# Patient Record
Sex: Male | Born: 1970 | Race: Black or African American | Hispanic: No | Marital: Single | Smoking: Never smoker
Health system: Southern US, Community
[De-identification: ages and names within clinical notes are randomized; demographics above are authoritative.]

## PROBLEM LIST (undated history)

## (undated) DIAGNOSIS — M419 Scoliosis, unspecified: Secondary | ICD-10-CM

## (undated) DIAGNOSIS — M549 Dorsalgia, unspecified: Secondary | ICD-10-CM

---

## 2011-05-11 ENCOUNTER — Emergency Department (HOSPITAL_COMMUNITY)
Admission: EM | Admit: 2011-05-11 | Discharge: 2011-05-11 | Disposition: A | Discharge status: Home / Self Care | Payer: Self-pay | Attending: Emergency Medicine | Admitting: Emergency Medicine

## 2011-05-11 DIAGNOSIS — W57XXXA Bitten or stung by nonvenomous insect and other nonvenomous arthropods, initial encounter: Secondary | ICD-10-CM | POA: Insufficient documentation

## 2011-05-11 DIAGNOSIS — R21 Rash and other nonspecific skin eruption: Secondary | ICD-10-CM | POA: Insufficient documentation

## 2011-05-11 DIAGNOSIS — T148 Other injury of unspecified body region: Secondary | ICD-10-CM | POA: Insufficient documentation

## 2016-01-04 ENCOUNTER — Encounter (HOSPITAL_COMMUNITY): Payer: Self-pay | Admitting: Nurse Practitioner

## 2016-01-04 ENCOUNTER — Emergency Department (HOSPITAL_COMMUNITY)
Admission: EM | Admit: 2016-01-04 | Discharge: 2016-01-04 | Disposition: A | Discharge status: Home / Self Care | Payer: No Typology Code available for payment source | Attending: Emergency Medicine | Admitting: Emergency Medicine

## 2016-01-04 ENCOUNTER — Emergency Department (HOSPITAL_COMMUNITY): Payer: No Typology Code available for payment source

## 2016-01-04 DIAGNOSIS — M419 Scoliosis, unspecified: Secondary | ICD-10-CM | POA: Insufficient documentation

## 2016-01-04 DIAGNOSIS — Y92410 Unspecified street and highway as the place of occurrence of the external cause: Secondary | ICD-10-CM | POA: Diagnosis not present

## 2016-01-04 DIAGNOSIS — S3992XA Unspecified injury of lower back, initial encounter: Secondary | ICD-10-CM | POA: Insufficient documentation

## 2016-01-04 DIAGNOSIS — S3991XA Unspecified injury of abdomen, initial encounter: Secondary | ICD-10-CM | POA: Diagnosis not present

## 2016-01-04 DIAGNOSIS — Y9389 Activity, other specified: Secondary | ICD-10-CM | POA: Diagnosis not present

## 2016-01-04 DIAGNOSIS — R1030 Lower abdominal pain, unspecified: Secondary | ICD-10-CM

## 2016-01-04 DIAGNOSIS — Y998 Other external cause status: Secondary | ICD-10-CM | POA: Insufficient documentation

## 2016-01-04 DIAGNOSIS — M545 Low back pain: Secondary | ICD-10-CM

## 2016-01-04 HISTORY — DX: Scoliosis, unspecified: M41.9

## 2016-01-04 HISTORY — DX: Dorsalgia, unspecified: M54.9

## 2016-01-04 MED ORDER — IBUPROFEN 200 MG PO TABS
600.0000 mg | ORAL_TABLET | Freq: Once | ORAL | Status: AC
  Administered 2016-01-04: 600 mg via ORAL
  Filled 2016-01-04: qty 3

## 2016-01-04 MED ORDER — HYDROCODONE-ACETAMINOPHEN 5-325 MG PO TABS
1.0000 | ORAL_TABLET | Freq: Once | ORAL | Status: AC
  Administered 2016-01-04: 1 via ORAL
  Filled 2016-01-04: qty 1

## 2016-01-04 MED ORDER — IBUPROFEN 600 MG PO TABS: 600.0000 mg | ORAL_TABLET | Freq: Three times a day (TID) | ORAL | Status: AC | PRN

## 2016-01-04 MED ORDER — HYDROCODONE-ACETAMINOPHEN 5-325 MG PO TABS: 1.0000 | ORAL_TABLET | ORAL | Status: DC | PRN
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
## 2016-01-12 ENCOUNTER — Ambulatory Visit: Payer: Self-pay

## 2016-01-19 ENCOUNTER — Ambulatory Visit: Payer: Self-pay | Attending: Internal Medicine

## 2016-02-23 ENCOUNTER — Ambulatory Visit: Payer: Self-pay | Admitting: Family Medicine

## 2016-05-08 ENCOUNTER — Emergency Department (HOSPITAL_COMMUNITY)
Admission: EM | Admit: 2016-05-08 | Discharge: 2016-05-08 | Disposition: A | Discharge status: Home / Self Care | Payer: PRIVATE HEALTH INSURANCE | Attending: Emergency Medicine | Admitting: Emergency Medicine

## 2016-05-08 ENCOUNTER — Encounter (HOSPITAL_COMMUNITY): Payer: Self-pay

## 2016-05-08 DIAGNOSIS — R1013 Epigastric pain: Secondary | ICD-10-CM | POA: Diagnosis present

## 2016-05-08 DIAGNOSIS — K219 Gastro-esophageal reflux disease without esophagitis: Secondary | ICD-10-CM | POA: Diagnosis not present

## 2016-05-08 LAB — CBC
HEMATOCRIT: 39.7 % (ref 39.0–52.0)
HEMOGLOBIN: 13 g/dL (ref 13.0–17.0)
MCH: 29.7 pg (ref 26.0–34.0)
MCHC: 32.7 g/dL (ref 30.0–36.0)
MCV: 90.8 fL (ref 78.0–100.0)
PLATELETS: 323 10*3/uL (ref 150–400)
RBC: 4.37 MIL/uL (ref 4.22–5.81)
RDW: 12.3 % (ref 11.5–15.5)
WBC: 10.1 10*3/uL (ref 4.0–10.5)

## 2016-05-08 LAB — URINALYSIS, ROUTINE W REFLEX MICROSCOPIC
Bilirubin Urine: NEGATIVE
GLUCOSE, UA: NEGATIVE mg/dL
HGB URINE DIPSTICK: NEGATIVE
Ketones, ur: NEGATIVE mg/dL
LEUKOCYTES UA: NEGATIVE
Nitrite: NEGATIVE
PROTEIN: NEGATIVE mg/dL
SPECIFIC GRAVITY, URINE: 1.009 (ref 1.005–1.030)
pH: 7.5 (ref 5.0–8.0)

## 2016-05-08 LAB — COMPREHENSIVE METABOLIC PANEL
ALT: 13 U/L — AB (ref 17–63)
ANION GAP: 10 (ref 5–15)
AST: 21 U/L (ref 15–41)
Albumin: 4 g/dL (ref 3.5–5.0)
Alkaline Phosphatase: 57 U/L (ref 38–126)
BUN: 8 mg/dL (ref 6–20)
CHLORIDE: 104 mmol/L (ref 101–111)
CO2: 23 mmol/L (ref 22–32)
Calcium: 9.5 mg/dL (ref 8.9–10.3)
Creatinine, Ser: 1.07 mg/dL (ref 0.61–1.24)
Glucose, Bld: 147 mg/dL — ABNORMAL HIGH (ref 65–99)
POTASSIUM: 3.8 mmol/L (ref 3.5–5.1)
SODIUM: 137 mmol/L (ref 135–145)
Total Bilirubin: 0.5 mg/dL (ref 0.3–1.2)
Total Protein: 6.2 g/dL — ABNORMAL LOW (ref 6.5–8.1)

## 2016-05-08 LAB — POC OCCULT BLOOD, ED: Fecal Occult Bld: NEGATIVE

## 2016-05-08 LAB — LIPASE, BLOOD: LIPASE: 40 U/L (ref 11–51)

## 2016-05-08 MED ORDER — HYDROCODONE-ACETAMINOPHEN 5-325 MG PO TABS: ORAL_TABLET | ORAL | Status: AC

## 2016-05-08 MED ORDER — SUCRALFATE 1 GM/10ML PO SUSP: 1.0000 g | Freq: Three times a day (TID) | ORAL | Status: AC

## 2016-05-08 MED ORDER — PANTOPRAZOLE SODIUM 40 MG PO TBEC
40.0000 mg | DELAYED_RELEASE_TABLET | Freq: Once | ORAL | Status: AC
  Administered 2016-05-08: 40 mg via ORAL
  Filled 2016-05-08: qty 1

## 2016-05-08 MED ORDER — PANTOPRAZOLE SODIUM 20 MG PO TBEC: 20.0000 mg | DELAYED_RELEASE_TABLET | Freq: Two times a day (BID) | ORAL | Status: AC

## 2016-05-08 MED ORDER — GI COCKTAIL ~~LOC~~
30.0000 mL | Freq: Once | ORAL | Status: AC
  Administered 2016-05-08: 30 mL via ORAL
  Filled 2016-05-08: qty 30

## 2016-05-08 NOTE — ED Notes (Signed)
Pt ambulated to and from restroom. 

## 2016-05-08 NOTE — ED Notes (Signed)
PA Nicole at the bedside  

## 2016-05-08 NOTE — ED Notes (Signed)
Patient here with intermittent abdominal cramps for 2-3 months. No nausea, no diarrhea. Has ben taking reflux meds with some relief. No associated symptoms

## 2016-05-08 NOTE — Discharge Instructions (Signed)
Do not take any NSAIDs (motrin, ibuprofen, advil, aleve, excedrin, aspirin, naproxen, goody's powder,  etc.) because they can further irritate your stomach  Do not hesitate to return to the emergency room for any new, worsening or concerning symptoms.  Please obtain primary care using resource guide below. Let them know that you were seen in the emergency room and that they will need to obtain records for further outpatient management.    Food Choices for Gastroesophageal Reflux Disease, Adult When you have gastroesophageal reflux disease (GERD), the foods you eat and your eating habits are very important. Choosing the right foods can help ease your discomfort.  WHAT GUIDELINES DO I NEED TO FOLLOW?   Choose fruits, vegetables, whole grains, and low-fat dairy products.   Choose low-fat meat, fish, and poultry.  Limit fats such as oils, salad dressings, butter, nuts, and avocado.   Keep a food diary. This helps you identify foods that cause symptoms.   Avoid foods that cause symptoms. These may be different for everyone.   Eat small meals often instead of 3 large meals a day.   Eat your meals slowly, in a place where you are relaxed.   Limit fried foods.   Cook foods using methods other than frying.   Avoid drinking alcohol.   Avoid drinking large amounts of liquids with your meals.   Avoid bending over or lying down until 2-3 hours after eating.  WHAT FOODS ARE NOT RECOMMENDED?  These are some foods and drinks that may make your symptoms worse: Vegetables Tomatoes. Tomato juice. Tomato and spaghetti sauce. Chili peppers. Onion and garlic. Horseradish. Fruits Oranges, grapefruit, and lemon (fruit and juice). Meats High-fat meats, fish, and poultry. This includes hot dogs, ribs, ham, sausage, salami, and bacon. Dairy Whole milk and chocolate milk. Sour cream. Cream. Butter. Ice cream. Cream cheese.  Drinks Coffee and tea. Bubbly (carbonated) drinks or energy  drinks. Condiments Hot sauce. Barbecue sauce.  Sweets/Desserts Chocolate and cocoa. Donuts. Peppermint and spearmint. Fats and Oils High-fat foods. This includes JamaicaFrench fries and potato chips. Other Vinegar. Strong spices. This includes black pepper, white pepper, red pepper, cayenne, curry powder, cloves, ginger, and chili powder. The items listed above may not be a complete list of foods and drinks to avoid. Contact your dietitian for more information.   This information is not intended to replace advice given to you by your health care provider. Make sure you discuss any questions you have with your health care provider.   Document Released: 05/02/2012 Document Revised: 11/22/2014 Document Reviewed: 09/05/2013 Elsevier Interactive Patient Education 2016 [employer]  [employer] Resource Guide Financial Assistance The United Ways 211 is a great source of information about community services available.  Access by dialing 2-1-1 from anywhere in [place], or by website -  PooledIncome.plwww.nc211.org.   Other Local Resources (Updated 11/2015)  Financial Assistance   Services    Phone Number and Address  [facility]  Low-cost medical care - 1st and 3rd Saturday of every month  Must not qualify for public or private insurance and must have limited income [phone] [address] [place], [place]    [place] Department of Social Services  Child care  Emergency assistance for housing and Kimberly-Clarkutilities  Food stamps  Medicaid [phone] [address] [place], [place] [address]   [facility]  Low-cost medical care for children, communicable diseases, sexually-transmitted diseases, immunizations, maternity care, womens health and family planning [phone] [place], [place]  [address]  [facility]   Medication assistance for [facility]  residents  Must meet income requirements [phone] [address] [place], [place].    [facility] Social Services  Child care  Emergency assistance for housing and Kimberly-Clarkutilities  Food stamps  Medicaid [phone] [address] [place], [place] [address]  [facility]   Low-cost medical care,   Monday through Friday, 9 am to 6 pm.   Accepts Medicare/Medicaid, and self-pay [phone] [address] [place], [place] [address]  [facility]  Low-cost medical care - Monday through Friday, 8:30 am - 5:30 pm  Accepts Medicaid and self-pay [phone] [address] [place], [place] [address]   [facility]  Primary medical care, including for those with sickle cell disease  Accepts Medicare, Medicaid, insurance and self-pay [phone] [address] [place], [place]  [facility]   Primary medical care  Accepts Medicare, [place], insurance and self-pay [phone] [address], [address] [place], [place] [address]   [facility] of Social Services  Child care  Emergency assistance for housing and Kimberly-[employer]arkutilities  Food stamps  Medicaid [phone] [place], [place] [address]  [facility] of Health and [employer]Human Services  Child care  Emergency assistance for housing and Kimberly-Clarkutilities  Food stamps  Medicaid [phone] [address] [place], [place] [address]   [facility] Medication [facility]  Medication assistance for [facility] residents with no insurance only  Must have a primary care doctor [phone] [address], [address] [place], [place]  [facility]   Primary medical care  [place] Medicare, [place], insurance  [phone] [address] [place], [place]  MedAssist   Medication assistance [phone]  [facility]   Primary  medical care  Accepts Medicare, [place], insurance and self-pay [phone] [address] [place], [place] [address]  [facility]   Primary medical care  Accepts Medicare, [place], insurance and self-pay [phone] [address] [place], [place] [address]  [facility]  For [place] residents between the ages of 9318 and 4264 who do not have any form of health insurance, Medicare, [place]icaid, or [place] benefits.  Services are provided free of charge to uninsured patients who fall within federal poverty guidelines.    Hours: Tuesdays and Thursdays, 4:15 - 8 pm [phone] [address], [address] [place], [place] [address]  [facility]     Primary medical care  Dental care  Nutritional counseling  Pharmacy  Accepts Medicaid, Medicare, most insurance.  Fees are adjusted based on ability to pay.   [phone] [facility] [address] [place], [place]  [phone] [facility]les [facility] [address] [place], [place]  [phone] [facility] [place], [place]  [phone] [facility], [address] [place], [place]  [phone] [facility] [address] [place], [place]  [facility]  Womens health and family planning [phone] [address] [place], [place]  [facility] Department of Social Services  Child care  Emergency assistance for housing and Kimberly-Clarkutilities  Food stamps  Medicaid [phone] [address], [place], [place] [address]   [facility]    Ages 5218 and older  Hours: Mondays and Thursdays, 7:00 am - 9:00 am Patients are seen on a first come, first served  basis. [phone], [phone] [place], [place]  [facility] Division of Social Services  Child care  Emergency assistance for housing and [employer]  Medicaid  [phone] [address] [place], [place] [address]  [employer]  Medication assistance  Rental assistance  Food pantry  Medication assistance  Housing assistance  Emergency food distribution  Utility assistance [phone] [address] [place], [place]  [phone]  [address] [place], [place] [address] Hours: Tuesdays and Thursdays from 9am - 12 noon by appointment only  [phone] [address] [place], [place] [address]  [facility] Lanae Boast   Accepts private insurance, [place], and [place].  Payment is based on a sliding scale for those without insurance.  Hours: Mondays, Tuesdays and Thursdays, 8:30 am - 5:30 pm.   [phone] [place], [place]  [facility] - Family Medicine at [facility], [place], and [place].  Payment is based on a sliding scale for those without insurance. [phone] [address] [place], [place]  [facility] - Pediatrics at E. Scientist, research (physical sciences), [employer], and [place].  Payment is based on a sliding scale for those without insurance [phone] [address], [employer], [place]  [facility] - Pediatrics at [employer], [place], and [place].  Payment is based on a sliding scale for those without insurance. [phone] [address] [place], [place]  [facility] - Pediatrics at [facility], [place], and [place].  Payment is based on a sliding scale for those without insurance. [phone], [phone] [place], [place].    [facility]  Maternity care.  Accepts Medicaid and self-pay. [phone] [address] [place], [place]
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
## 2016-12-15 ENCOUNTER — Other Ambulatory Visit: Payer: Self-pay | Admitting: Orthopaedic Surgery

## 2016-12-15 DIAGNOSIS — M47816 Spondylosis without myelopathy or radiculopathy, lumbar region: Secondary | ICD-10-CM

## 2016-12-21 ENCOUNTER — Ambulatory Visit
Admission: RE | Admit: 2016-12-21 | Discharge: 2016-12-21 | Disposition: A | Discharge status: Home / Self Care | Payer: Self-pay | Source: Ambulatory Visit | Attending: Orthopaedic Surgery | Admitting: Orthopaedic Surgery

## 2016-12-21 DIAGNOSIS — M47816 Spondylosis without myelopathy or radiculopathy, lumbar region: Secondary | ICD-10-CM

## 2018-05-01 IMAGING — MR MR LUMBAR SPINE W/O CM
4 of 5 series · 18 of 48 positions shown · non-contrast
Comparison: None.

CLINICAL DATA: Back spasms, low back pain and bilateral lower
extremity numbness

EXAM:
MRI LUMBAR SPINE WITHOUT CONTRAST
TECHNIQUE: Multiplanar, multisequence MR imaging of the lumbar spine was
performed. No intravenous contrast was administered.

[Series 6: T2 · sagittal · 4.0mm · 0.73mm/px · 6 of 15 slices shown (1 of 2)]
[im 1/15]
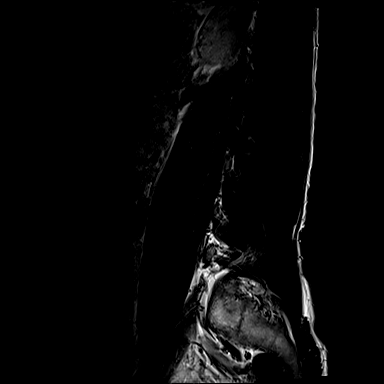
[im 3/15]
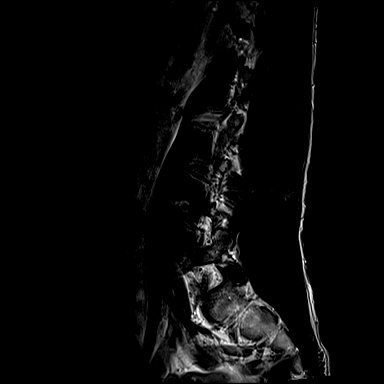
[im 6/15]
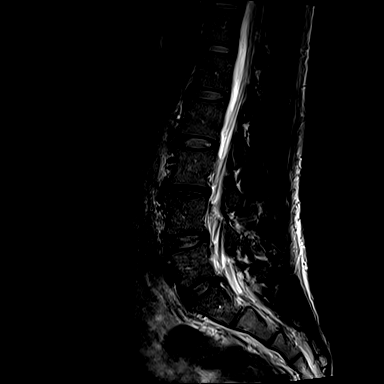
[im 9/15]
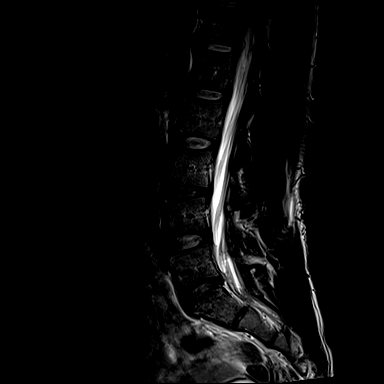
[im 12/15]
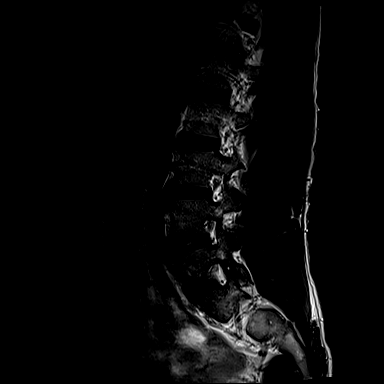
[im 15/15]
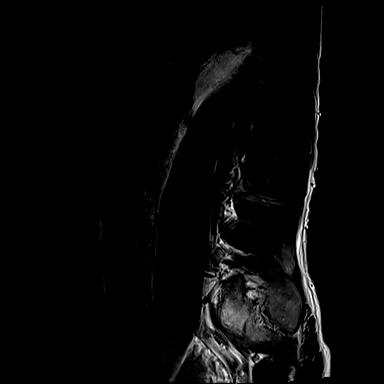

[Series 7: T1 · sagittal · 4.0mm · 0.73mm/px · 3 of 15 slices shown (1 of 2)]
[im 3/15]
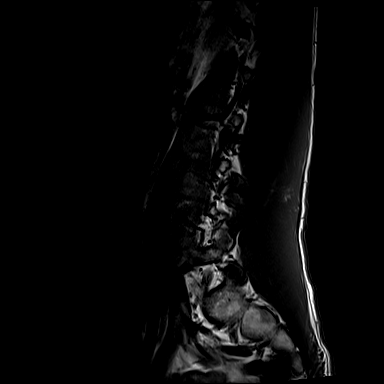
[im 9/15]
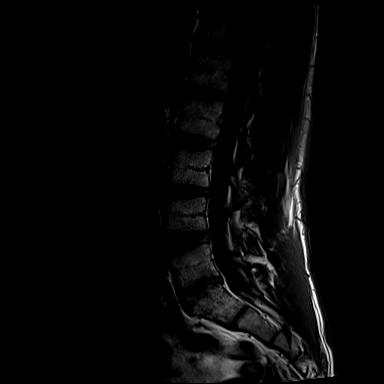
[im 15/15]
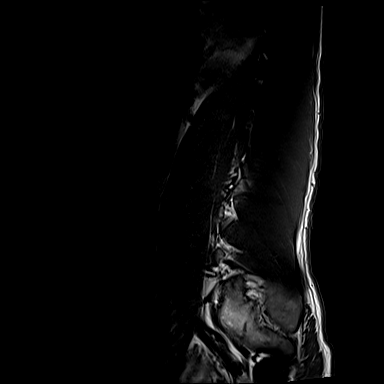

[Series 11: T1 · axial · 4.0mm · 0.28mm/px · z∈[-75,+72]mm · 3 of 36 slices shown (2 of 2)]
[im 6/36]
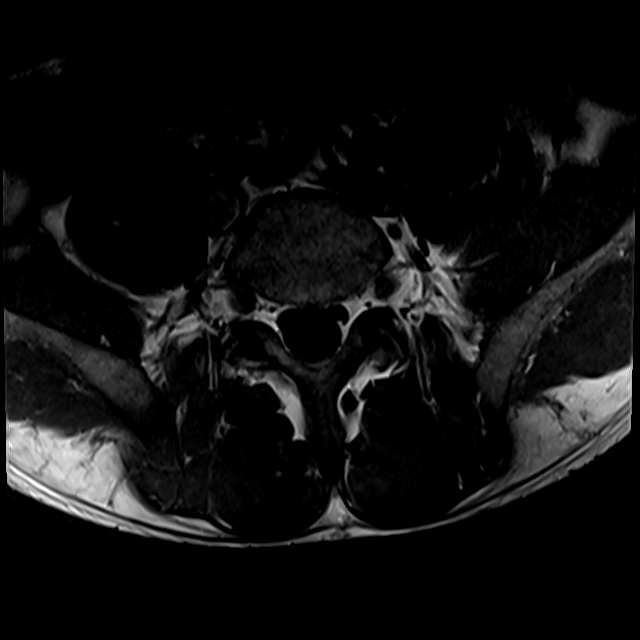
[im 18/36]
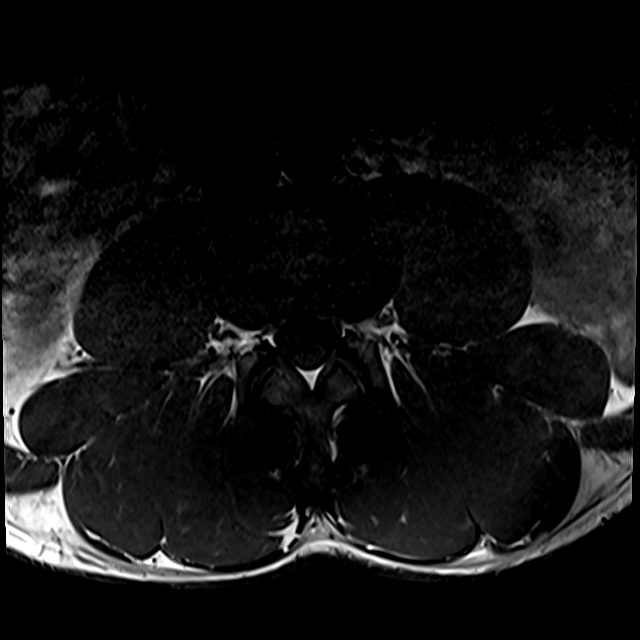
[im 31/36]
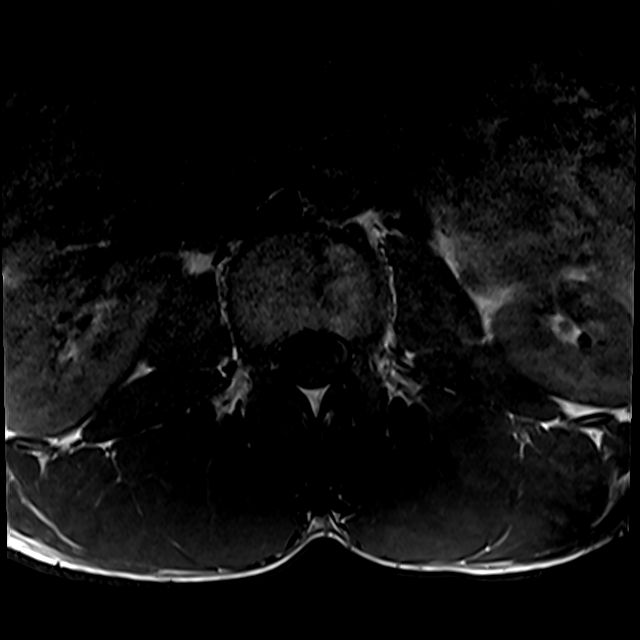

[Series 14: T2 · axial · 4.0mm · 0.28mm/px · z∈[-99,+72]mm · 6 of 36 slices shown (2 of 2)]
[im 1/36]
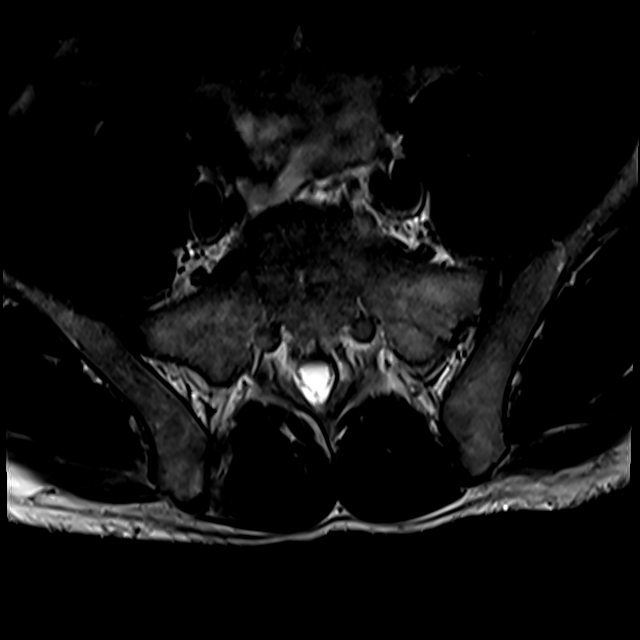
[im 6/36]
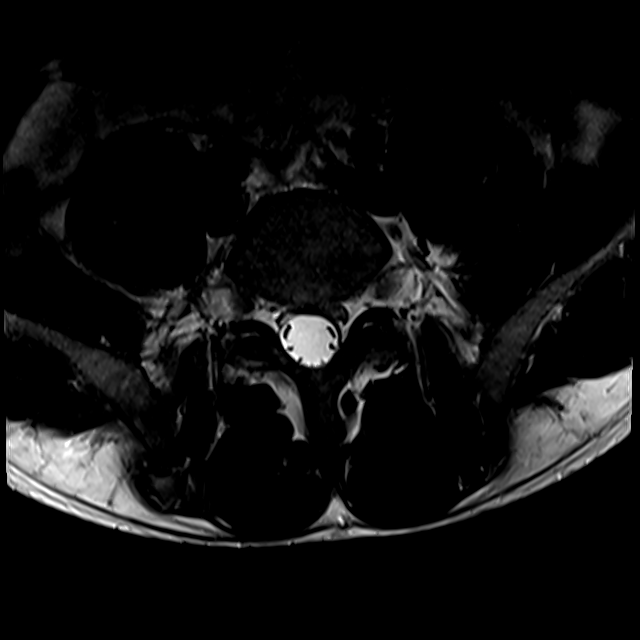
[im 11/36]
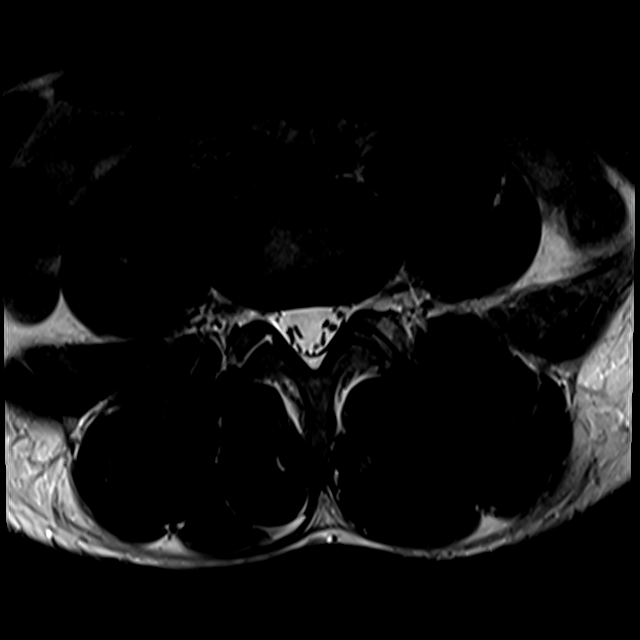
[im 16/36]
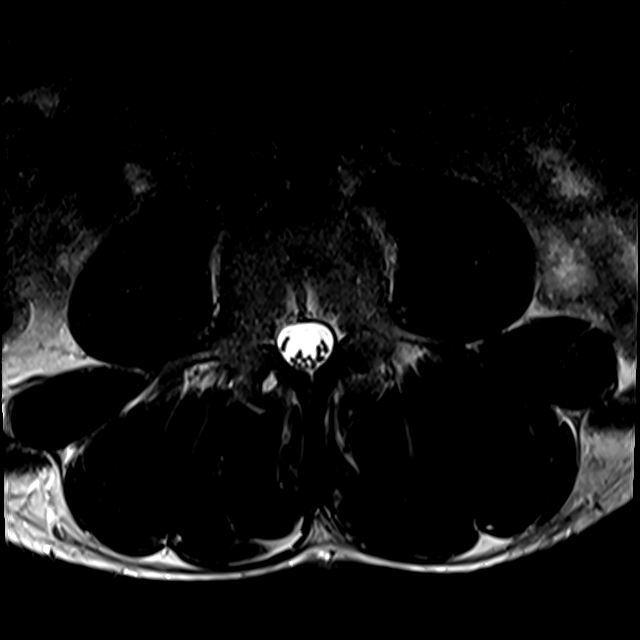
[im 18/36]
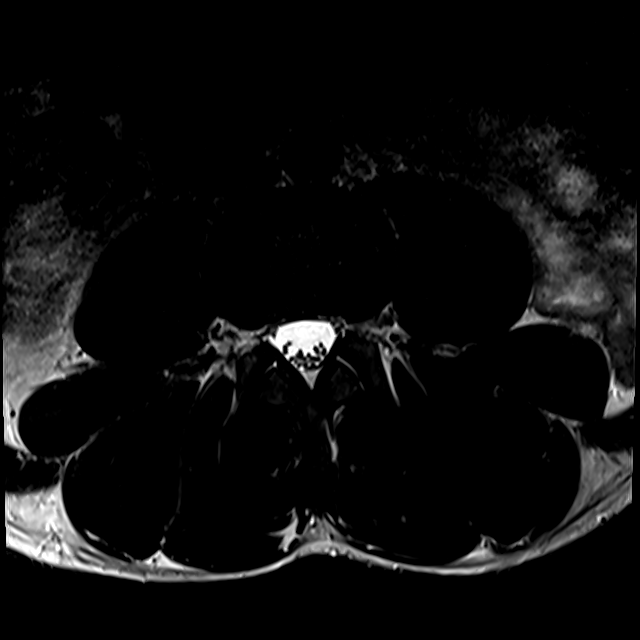
[im 31/36]
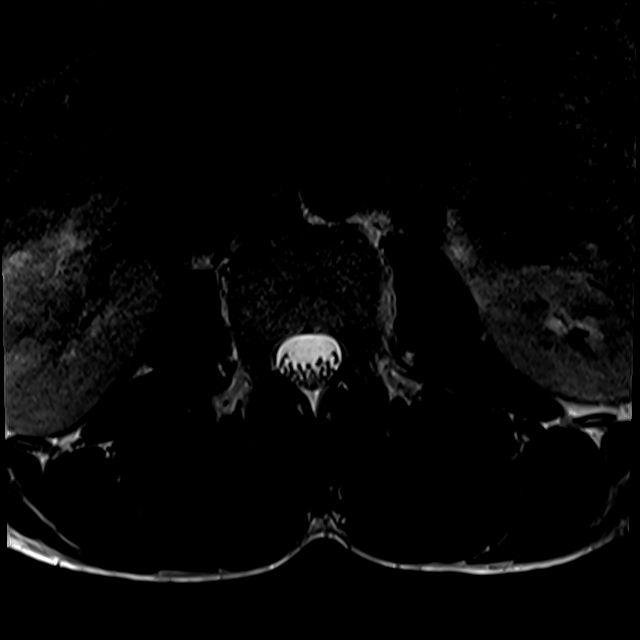

[18 of 48 positions shown; findings below may reference images not displayed]

FINDINGS: Segmentation: Normal. The lowest disc space is considered to be
L5-S1.

Alignment:  Normal

Vertebrae: No acute compression fracture, discitis-osteomyelitis,
facet edema or other focal marrow lesion. No epidural collection.

Conus medullaris: Extends to the L1 level and appears normal.

Paraspinal and other soft tissues: The visualized aorta, IVC and
iliac vessels are normal. The visualized retroperitoneal organs and
paraspinal soft tissues are normal.

Disc levels:

T12-L1: Normal disc space and facets. No spinal canal or
neuroforaminal stenosis.

L1-L2: Normal disc space and facets. No spinal canal or
neuroforaminal stenosis.

L2-L3: Normal disc space and facets. No spinal canal or
neuroforaminal stenosis.

L3-L4: Normal disc space and facets. No spinal canal or
neuroforaminal stenosis.

L4-L5: Normal disc space and facets. No spinal canal or
neuroforaminal stenosis.

L5-S1: Small left subarticular disc protrusion, in close proximity
to the descending S1 nerve root. No spinal canal or neural foraminal
stenosis.
IMPRESSION: Small L5-S1 left subarticular disc protrusion narrowing the left
lateral recess. Correlate for left S1 radiculopathy.
